# Patient Record
Sex: Male | Born: 2005 | Hispanic: Yes | Marital: Single | Smoking: Never smoker
Health system: Southern US, Community
[De-identification: ages and names within clinical notes are randomized; demographics above are authoritative.]

---
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
## 2023-02-26 ENCOUNTER — Ambulatory Visit: Payer: Medicaid Other

## 2023-02-26 DIAGNOSIS — Z23 Encounter for immunization: Secondary | ICD-10-CM

## 2023-02-26 DIAGNOSIS — Z719 Counseling, unspecified: Secondary | ICD-10-CM

## 2023-02-26 NOTE — Progress Notes (Signed)
Pt in nurse clinic requesting Flu vaccine, eligible per [facility]. Administered vaccine, tolerated well. Given VIS and [facility] copies, explained and understood. M.Chancy Smigiel,LPN.

## 2023-04-05 ENCOUNTER — Other Ambulatory Visit
